# Patient Record
Sex: Female | Born: 1995 | Race: White | Hispanic: No | Marital: Single | Smoking: Never smoker
Health system: Southern US, Community
[De-identification: ages and names within clinical notes are randomized; demographics above are authoritative.]

---

## 2017-10-27 ENCOUNTER — Ambulatory Visit (INDEPENDENT_AMBULATORY_CARE_PROVIDER_SITE_OTHER): Payer: BLUE CROSS/BLUE SHIELD | Admitting: Family Medicine

## 2017-10-27 ENCOUNTER — Encounter: Payer: Self-pay | Admitting: Family Medicine

## 2017-10-27 VITALS — BP 118/69 | HR 52 | Temp 98.2°F | Resp 14

## 2017-10-27 DIAGNOSIS — R5383 Other fatigue: Secondary | ICD-10-CM

## 2017-10-27 DIAGNOSIS — M76899 Other specified enthesopathies of unspecified lower limb, excluding foot: Secondary | ICD-10-CM

## 2017-10-27 DIAGNOSIS — M6580 Other synovitis and tenosynovitis, unspecified site: Secondary | ICD-10-CM

## 2017-10-28 LAB — CBC WITH DIFFERENTIAL/PLATELET
BASOS ABS: 0 10*3/uL (ref 0.0–0.2)
Basos: 0 %
EOS (ABSOLUTE): 0.2 10*3/uL (ref 0.0–0.4)
Eos: 2 %
HEMOGLOBIN: 12 g/dL (ref 11.1–15.9)
Hematocrit: 35.9 % (ref 34.0–46.6)
IMMATURE GRANS (ABS): 0 10*3/uL (ref 0.0–0.1)
IMMATURE GRANULOCYTES: 0 %
LYMPHS: 22 %
Lymphocytes Absolute: 1.9 10*3/uL (ref 0.7–3.1)
MCH: 30.5 pg (ref 26.6–33.0)
MCHC: 33.4 g/dL (ref 31.5–35.7)
MCV: 91 fL (ref 79–97)
MONOCYTES: 8 %
Monocytes Absolute: 0.7 10*3/uL (ref 0.1–0.9)
NEUTROS PCT: 68 %
Neutrophils Absolute: 6.1 10*3/uL (ref 1.4–7.0)
PLATELETS: 266 10*3/uL (ref 150–379)
RBC: 3.94 x10E6/uL (ref 3.77–5.28)
RDW: 13.2 % (ref 12.3–15.4)
WBC: 8.9 10*3/uL (ref 3.4–10.8)

## 2017-10-28 LAB — IRON AND TIBC
Iron Saturation: 30 % (ref 15–55)
Iron: 103 ug/dL (ref 27–159)
TIBC: 343 ug/dL (ref 250–450)
UIBC: 240 ug/dL (ref 131–425)

## 2017-10-28 LAB — VITAMIN D 25 HYDROXY (VIT D DEFICIENCY, FRACTURES): VIT D 25 HYDROXY: 27.2 ng/mL — AB (ref 30.0–100.0)

## 2017-10-28 LAB — TSH: TSH: 2.11 u[IU]/mL (ref 0.450–4.500)

## 2017-10-28 LAB — FERRITIN: FERRITIN: 71 ng/mL (ref 15–77)

## 2017-11-08 ENCOUNTER — Ambulatory Visit
Admission: RE | Admit: 2017-11-08 | Discharge: 2017-11-08 | Disposition: A | Discharge status: Home / Self Care | Payer: BLUE CROSS/BLUE SHIELD | Source: Ambulatory Visit | Attending: Family Medicine | Admitting: Family Medicine

## 2017-11-08 DIAGNOSIS — R102 Pelvic and perineal pain: Secondary | ICD-10-CM | POA: Insufficient documentation

## 2017-11-08 DIAGNOSIS — M76899 Other specified enthesopathies of unspecified lower limb, excluding foot: Secondary | ICD-10-CM

## 2017-12-06 ENCOUNTER — Ambulatory Visit (INDEPENDENT_AMBULATORY_CARE_PROVIDER_SITE_OTHER): Payer: PRIVATE HEALTH INSURANCE | Admitting: Family Medicine

## 2017-12-06 ENCOUNTER — Encounter: Payer: Self-pay | Admitting: Family Medicine

## 2017-12-06 VITALS — BP 111/72 | HR 54 | Temp 97.9°F | Resp 14

## 2017-12-06 DIAGNOSIS — H1089 Other conjunctivitis: Secondary | ICD-10-CM

## 2017-12-06 DIAGNOSIS — L03031 Cellulitis of right toe: Secondary | ICD-10-CM

## 2017-12-06 MED ORDER — SULFAMETHOXAZOLE-TRIMETHOPRIM 800-160 MG PO TABS: 1.0000 | ORAL_TABLET | Freq: Two times a day (BID) | ORAL | 0 refills | Status: DC

## 2017-12-06 MED ORDER — POLYMYXIN B-TRIMETHOPRIM 10000-0.1 UNIT/ML-% OP SOLN: 1.0000 [drp] | Freq: Four times a day (QID) | OPHTHALMIC | 0 refills | Status: DC

## 2017-12-06 NOTE — Progress Notes (Signed)
Patient presents today with symptoms of right great toe pain. Patient states that she has had an ingrown toenail for a few days with discharge that was expressed yesterday. She states that the pain has improved. She denies any other problems at the toe. Patient also presents with discharge from the left eye this morning. There is mild redness of the eye. She denies any other significant URI symptoms besides a dry cough. She denies any fever or chills. She does wear contact lenses and does take them out nightly. They are monthly contact lenses.  ROS: Negative except mentioned above. Vitals as per Epic. GENERAL: NAD HEENT: no pharyngeal erythema, no exudate, no erythema of TMs, no cervical LAD, mild erythema of left conjunctiva, no significant discharge noted RESP: CTA B CARD: RRR SKIN: small paronychia right big toe, mild erythema along the lateral border of the right great toe, no significant discharge from the area, mild tenderness of the area to palpation NEURO: CN II-XII grossly intact   A/P: Paronychia - will treat with Bactrim DS, keep area clean and dry, if symptoms persist we'll send patient to podiatrist.   Conjunctivitis left eye - will treat with Polytrim, frequent handwashing, Claritin when necessary, do not wear contact lenses until symptoms have resolved, wash any linens that have touched the face, no athletic activity in the weight room today, seek medical attention if symptoms persist or worsen as discussed.

## 2017-12-12 ENCOUNTER — Ambulatory Visit (INDEPENDENT_AMBULATORY_CARE_PROVIDER_SITE_OTHER): Payer: PRIVATE HEALTH INSURANCE | Admitting: Family Medicine

## 2017-12-12 ENCOUNTER — Ambulatory Visit
Admission: RE | Admit: 2017-12-12 | Discharge: 2017-12-12 | Disposition: A | Discharge status: Home / Self Care | Payer: BLUE CROSS/BLUE SHIELD | Source: Ambulatory Visit | Attending: Family Medicine | Admitting: Family Medicine

## 2017-12-12 ENCOUNTER — Encounter: Payer: Self-pay | Admitting: Family Medicine

## 2017-12-12 VITALS — BP 114/75 | HR 51 | Temp 97.6°F | Resp 14

## 2017-12-12 DIAGNOSIS — R059 Cough, unspecified: Secondary | ICD-10-CM

## 2017-12-12 DIAGNOSIS — R05 Cough: Secondary | ICD-10-CM

## 2017-12-12 MED ORDER — BENZONATATE 100 MG PO CAPS: 100.0000 mg | ORAL_CAPSULE | Freq: Two times a day (BID) | ORAL | 0 refills | Status: AC | PRN

## 2017-12-13 NOTE — Progress Notes (Signed)
Patient presents today with symptoms of cough. She does admit that it is productive at times. She has been having a cough about 3 weeks. She states that initially started off like a cold. She is unsure of any new allergens. She denies any chest pain or shortness of breath. She denies any fever, weight loss, severe headache. She denies any history of asthma. Patient is continuing to take Bactrim that was prescribed for her paronychia. She denies any toe pain at this time and states that it is much better.  ROS: Negative except mentioned above. Vitals as per Epic. GENERAL: NAD HEENT: mild pharyngeal erythema, no exudate, no erythema of TMs, no cervical LAD RESP: CTA B CARD: RRR NEURO: CN II-XII grossly intact   A/P: Bronchitis - will get chest x-ray given length of time of symptoms, would finish course of Bactrim, Tessalon Perles when necessary, recommend starting oral antihistamine such as Claritin or Zyrtec daily, seek medical attention if symptoms persist or worsen as discussed. No athletic activity or class if febrile. Discussed above with trainer.

## 2017-12-16 ENCOUNTER — Other Ambulatory Visit: Payer: Self-pay | Admitting: Family Medicine

## 2017-12-16 MED ORDER — ALBUTEROL SULFATE HFA 108 (90 BASE) MCG/ACT IN AERS: 2.0000 | INHALATION_SPRAY | Freq: Four times a day (QID) | RESPIRATORY_TRACT | 1 refills | Status: AC | PRN

## 2017-12-19 ENCOUNTER — Ambulatory Visit (INDEPENDENT_AMBULATORY_CARE_PROVIDER_SITE_OTHER): Payer: BLUE CROSS/BLUE SHIELD | Admitting: Family Medicine

## 2017-12-19 DIAGNOSIS — R059 Cough, unspecified: Secondary | ICD-10-CM

## 2017-12-19 DIAGNOSIS — R05 Cough: Secondary | ICD-10-CM

## 2017-12-19 DIAGNOSIS — R0981 Nasal congestion: Secondary | ICD-10-CM

## 2017-12-19 NOTE — Progress Notes (Signed)
Patient continues to have symptoms of cough and nasal congestion. Patient states that the albuterol inhaler has helped some. The [employer]les have helped with her cough as well. She feels that her nasal congestion has gotten worse. She is taking an oral antihistamine but not a decongestant or nasal steroid. She has felt wheezing at times with activity. She denies any chest pain or increased shortness of breath at rest. She denies any fever or night sweats. She has finished her prescription of Bactrim. Her chest x-ray was negative for pneumonia.  ROS: Negative except mentioned above. Vitals as per Epic. GENERAL: NAD HEENT: mild pharyngeal erythema, no exudate, no erythema of TMs, no cervical LAD RESP: Minimal expiratory wheeze bilaterally, no accessory muscle use CARD: RRR NEURO: CN II-XII grossly intact   A/P: Bronchitis, Nasal Congestion - advised patient to take Flonase or Sudafed along with her Claritin for her nasal congestion, will do CBC and mono spot test given length of symptoms, will also start patient on 6 day taper dose of Prednisone, continue to use Albuterol Inhaler 30 minutes before exercise and every 4-6 hours as needed. If any worsening of breathing symptoms and needs to seek medical attention. Discussed with trainer that she should do activity as tolerated. We'll advise on further treatment if needed once lab results have been reviewed.

## 2017-12-20 LAB — MONONUCLEOSIS SCREEN: MONO SCREEN: NEGATIVE

## 2017-12-20 LAB — CBC WITH DIFFERENTIAL/PLATELET
BASOS ABS: 0 10*3/uL (ref 0.0–0.2)
Basos: 0 %
EOS (ABSOLUTE): 0.2 10*3/uL (ref 0.0–0.4)
Eos: 4 %
HEMOGLOBIN: 12.9 g/dL (ref 11.1–15.9)
Hematocrit: 39.4 % (ref 34.0–46.6)
IMMATURE GRANS (ABS): 0 10*3/uL (ref 0.0–0.1)
Immature Granulocytes: 0 %
LYMPHS: 35 %
Lymphocytes Absolute: 1.9 10*3/uL (ref 0.7–3.1)
MCH: 30.3 pg (ref 26.6–33.0)
MCHC: 32.7 g/dL (ref 31.5–35.7)
MCV: 93 fL (ref 79–97)
MONOCYTES: 9 %
Monocytes Absolute: 0.5 10*3/uL (ref 0.1–0.9)
NEUTROS PCT: 52 %
Neutrophils Absolute: 2.8 10*3/uL (ref 1.4–7.0)
PLATELETS: 332 10*3/uL (ref 150–379)
RBC: 4.26 x10E6/uL (ref 3.77–5.28)
RDW: 13.5 % (ref 12.3–15.4)
WBC: 5.4 10*3/uL (ref 3.4–10.8)

## 2018-01-02 ENCOUNTER — Encounter: Payer: Self-pay | Admitting: Family Medicine

## 2018-01-02 ENCOUNTER — Ambulatory Visit (INDEPENDENT_AMBULATORY_CARE_PROVIDER_SITE_OTHER): Payer: BLUE CROSS/BLUE SHIELD | Admitting: Family Medicine

## 2018-01-02 VITALS — BP 105/71 | HR 85 | Temp 97.9°F | Resp 14

## 2018-01-02 DIAGNOSIS — J0110 Acute frontal sinusitis, unspecified: Secondary | ICD-10-CM

## 2018-01-02 MED ORDER — FLUTICASONE PROPIONATE 50 MCG/ACT NA SUSP: 2.0000 | Freq: Every day | NASAL | 0 refills | Status: DC

## 2018-01-02 MED ORDER — AMOXICILLIN-POT CLAVULANATE 875-125 MG PO TABS: 1.0000 | ORAL_TABLET | Freq: Two times a day (BID) | ORAL | 0 refills | Status: AC

## 2018-01-03 NOTE — Progress Notes (Signed)
Patient presents again for symptoms of nasal congestion, sinus pressure. Patient states that her cough has improved but does have it at times. She has been using the oral antihistamine with minimal improvement. She did not start the decongestant as we discussed last time. She denies any fever. She does state that her mucous is thick and green in color. She has sinus pressure and mild headache. She denies any chest pain, shortness of breath, weight loss, nausea, vomiting, diarrhea. She denies any new allergens. There was an episode of mold in her dorm a few months ago which was cleaned up. Her roommates do not have any similar symptoms. She has been using her Albuterol Inhaler before physical activity. Patient has had a chest x-ray, CBC, mono test which were all normal.patient also finished a course of Bactrim initially when she had a productive cough and paronychia. She denies any known history of asthma.  ROS: Negative except mentioned above.  GENERAL: NAD HEENT: mild pharyngeal erythema, no exudate, no erythema of TMs, mild tenderness of the frontal sinuses on palpation, mild erythema of nasal mucosa bilaterally, no cervical LAD RESP: CTA B CARD: RRR ABD: positive bowel sounds, nontender, no organomegaly appreciated NEURO: CN II-XII grossly intact   A/P: Sinusitis - will treat patient with Augmentin, tapered dose of Prednisonefor a longer period of time discussed and also  continuing oral antihistamine. Would also recommend starting nasal steroid or decongestant. If symptoms do not resolve or worsen would recommend referral to ENT. If symptoms do not resolve would also recommend reevaluating dorm room for mold again. Seek medical attention if any acute problems. Activity as tolerated. Continue to use Albuterol Inhaler before and during activity as needed. Patient addresses understanding of above and risks of using Prednisone. Will follow up as needed.

## 2018-01-12 NOTE — Progress Notes (Signed)
Patient presents with bilateral adductor discomfort. Patient believes the symptoms are due to the short fast sprints that she has been running during conditioning. She states that running longer distances does not cause her discomfort. She denies any groin pain or back pain associated with this. She denies any bruising or swelling of the area. Given this discomfort with short sprints her coach advised her to come and get blood work to make sure it isn't related to anemia.Patient denies any problems with anemia in the past. She denies any heavy menstrual cycles, also blood in urine or stool, restrictive eating behaviors, excessive fatigue, chest pain, shortness of breath.  ROS: Negative except mentioned above. Vitals as per Epic. GENERAL: NAD HEENT: no pharyngeal erythema, no exudate, no erythema of TMs, no cervical LAD RESP: CTA B CARD: RRR MSK: mild tenderness in the proximal abductor muscle group right greater than left, no ecchymosis or obvious swelling appreciated, mild discomfort with resisted adduction, no groin tenderness or bulge appreciated NEURO: CN II-XII grossly intact   A/P: Adductor Tendinitis - likely due to overuse, discussed modifying activity,recommend patient also see PT to see if there are any muscle imbalances, NSAIDs when necessary, seek medical attention if symptoms persist or worsen as discussed. Above was discussed with trainer.  Physical Fatigue - I believe this is related to her MSK discomfort above, will however do some basic lab work such as CBC, iron studies, vitamin D, he will inform patient of supplement if needed.

## 2018-01-23 ENCOUNTER — Other Ambulatory Visit: Payer: Self-pay | Admitting: Family Medicine

## 2018-01-23 ENCOUNTER — Ambulatory Visit
Admission: RE | Admit: 2018-01-23 | Discharge: 2018-01-23 | Disposition: A | Discharge status: Home / Self Care | Payer: BLUE CROSS/BLUE SHIELD | Source: Ambulatory Visit | Attending: Family Medicine | Admitting: Family Medicine

## 2018-01-23 DIAGNOSIS — S0992XA Unspecified injury of nose, initial encounter: Secondary | ICD-10-CM

## 2018-01-23 DIAGNOSIS — S022XXA Fracture of nasal bones, initial encounter for closed fracture: Secondary | ICD-10-CM | POA: Diagnosis not present

## 2018-01-23 DIAGNOSIS — X58XXXA Exposure to other specified factors, initial encounter: Secondary | ICD-10-CM | POA: Diagnosis not present

## 2018-01-24 ENCOUNTER — Ambulatory Visit (INDEPENDENT_AMBULATORY_CARE_PROVIDER_SITE_OTHER): Payer: BLUE CROSS/BLUE SHIELD | Admitting: Family Medicine

## 2018-01-24 ENCOUNTER — Encounter: Payer: Self-pay | Admitting: Family Medicine

## 2018-01-24 VITALS — BP 105/66 | HR 74 | Temp 97.7°F | Resp 14

## 2018-01-24 DIAGNOSIS — S022XXA Fracture of nasal bones, initial encounter for closed fracture: Secondary | ICD-10-CM

## 2018-01-24 NOTE — Progress Notes (Signed)
Patient presents today for recent nose injury 2 days ago. Patient states that a ball hit her right on the nose during practice drills with Lacrosse. She denies any concussive symptoms. She states that the nose did bleed that day but has stopped. She denies any headache, dizziness, vision problems. She states that breathing out of the left nostril is mildly difficult. She denies any blood going down the back of her throat. She denies any previous injury to the nose in the past. She has not been taking any medications consistently for her nose pain/swelling.  ROS: Negative except mentioned above. Vitals as per Epic. GENERAL: NAD HEENT: no pharyngeal erythema, no exudate, no erythema of TMs, no cervical LAD, mild tenderness along the bridge of the nose and on the sides proximally, moderate erythema of nasal mucosa bilaterally, no clots appreciated RESP: CTA B CARD: RRR NEURO: CN II-XII grossly intact   A/P: Nondisplaced Nasal Fracture - reviewed films with patient, will refer patient to ENT to be seen over the next 1-2 weeks, decreased swelling with NSAID/ice, patient would like to wear a custom mask during [employer] activity,she understands the risks of playing with a nasal fracture, discussed above with trainer, patient can do noncontact activity for now until she gets mask, should stop activity if she has any problems, seek medical attention if any worsening symptoms.

## 2018-01-27 ENCOUNTER — Other Ambulatory Visit: Payer: Self-pay | Admitting: Family Medicine

## 2018-01-27 DIAGNOSIS — S022XXA Fracture of nasal bones, initial encounter for closed fracture: Secondary | ICD-10-CM

## 2018-01-29 ENCOUNTER — Other Ambulatory Visit: Payer: Self-pay | Admitting: Family Medicine

## 2018-02-06 IMAGING — CR DG PELVIS 1-2V
1 series · 1 of 1 positions shown · non-contrast
Comparison: None.

CLINICAL DATA: Pain posteriorly within the pelvis for 3 weeks, no
trauma

EXAM:
PELVIS - 1-2 VIEW

[dg pelvis 1-2 views]
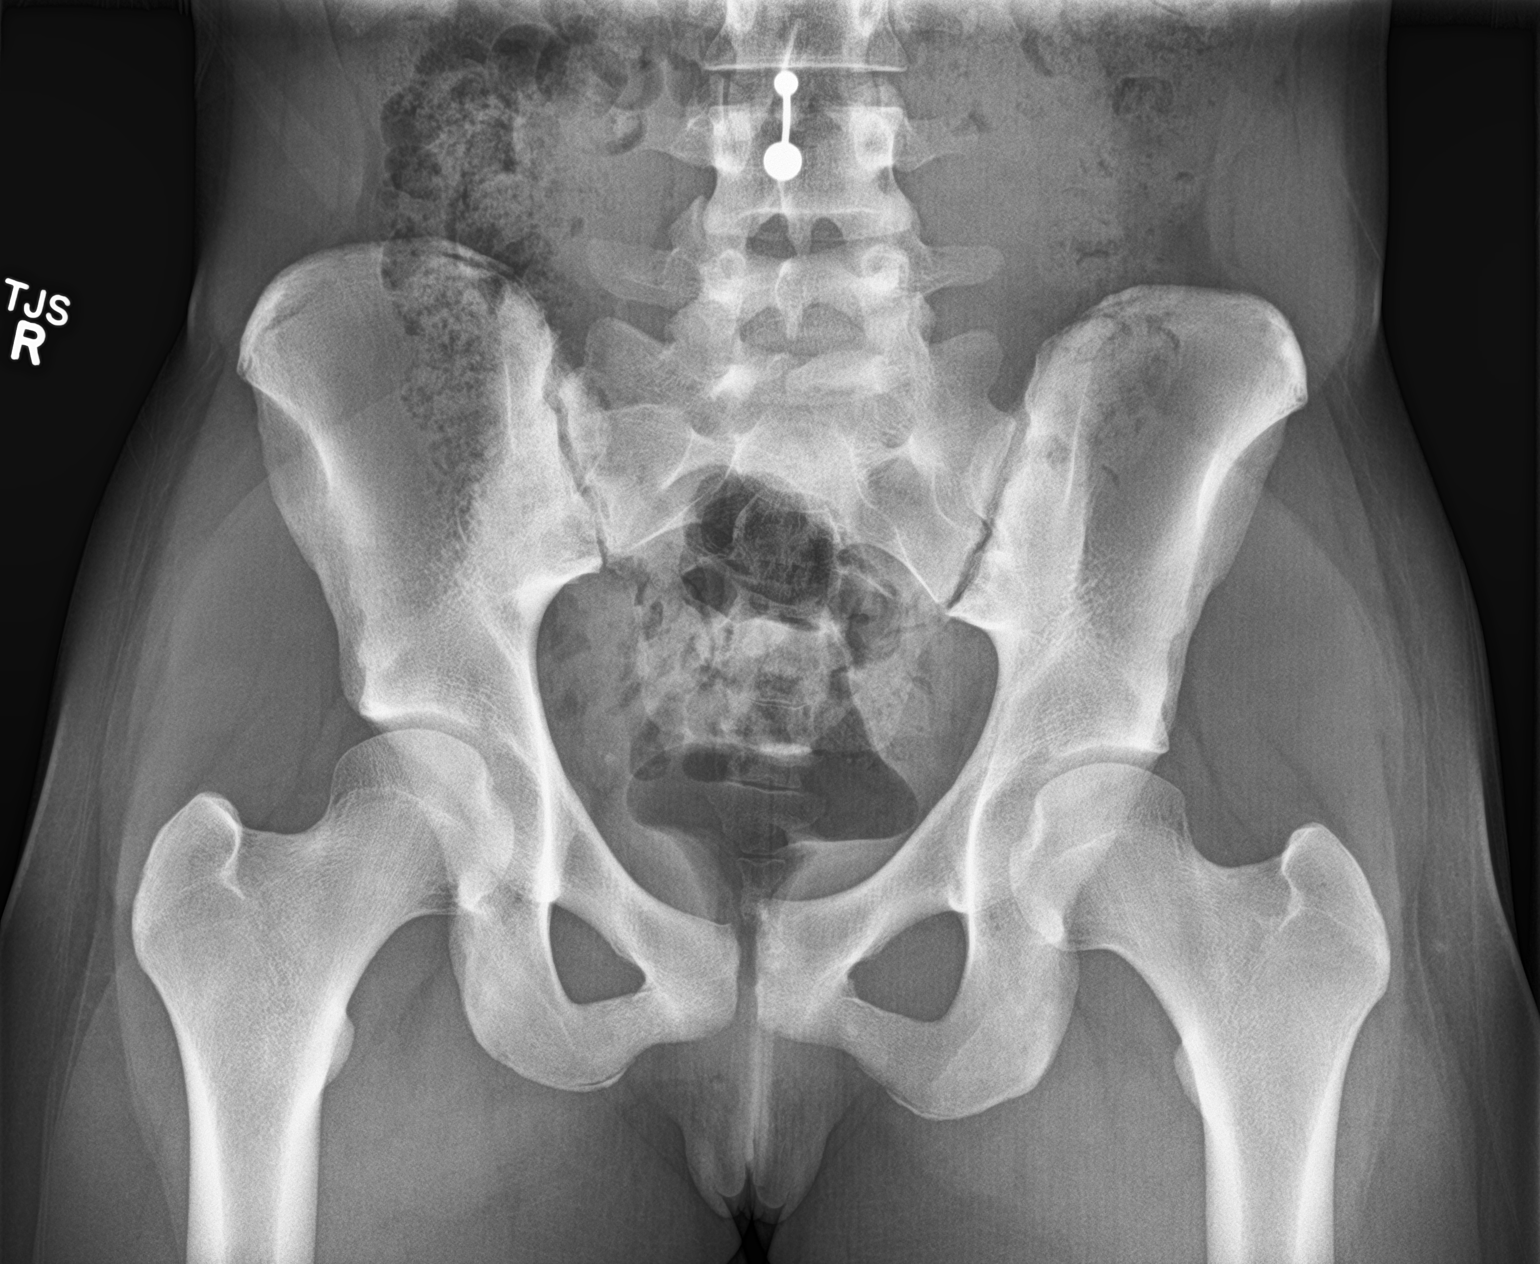

[1 of 1 positions shown; findings below may reference images not displayed]

FINDINGS: The hip joint spaces appear normal. No finding to indicate CAM
deformity is seen. The pelvic rami are intact. The SI joints are
corticated with slight irregularity of the left SI joint noted.
IMPRESSION: No acute abnormality. No evidence of CAM deformity. Slight
irregularity of the left SI joint of questionable significance.

## 2018-04-23 IMAGING — CR DG NASAL BONES 3+V
1 series · 4 of 4 positions shown · non-contrast
Comparison: None.

CLINICAL DATA: 21-year-old female with a history of facial injury
during lacrosse

EXAM:
NASAL BONES - 3+ VIEW

[Series 1: dg nasal bones · 0.14mm/px · 4 of 4 slices shown]
[im 1/4]
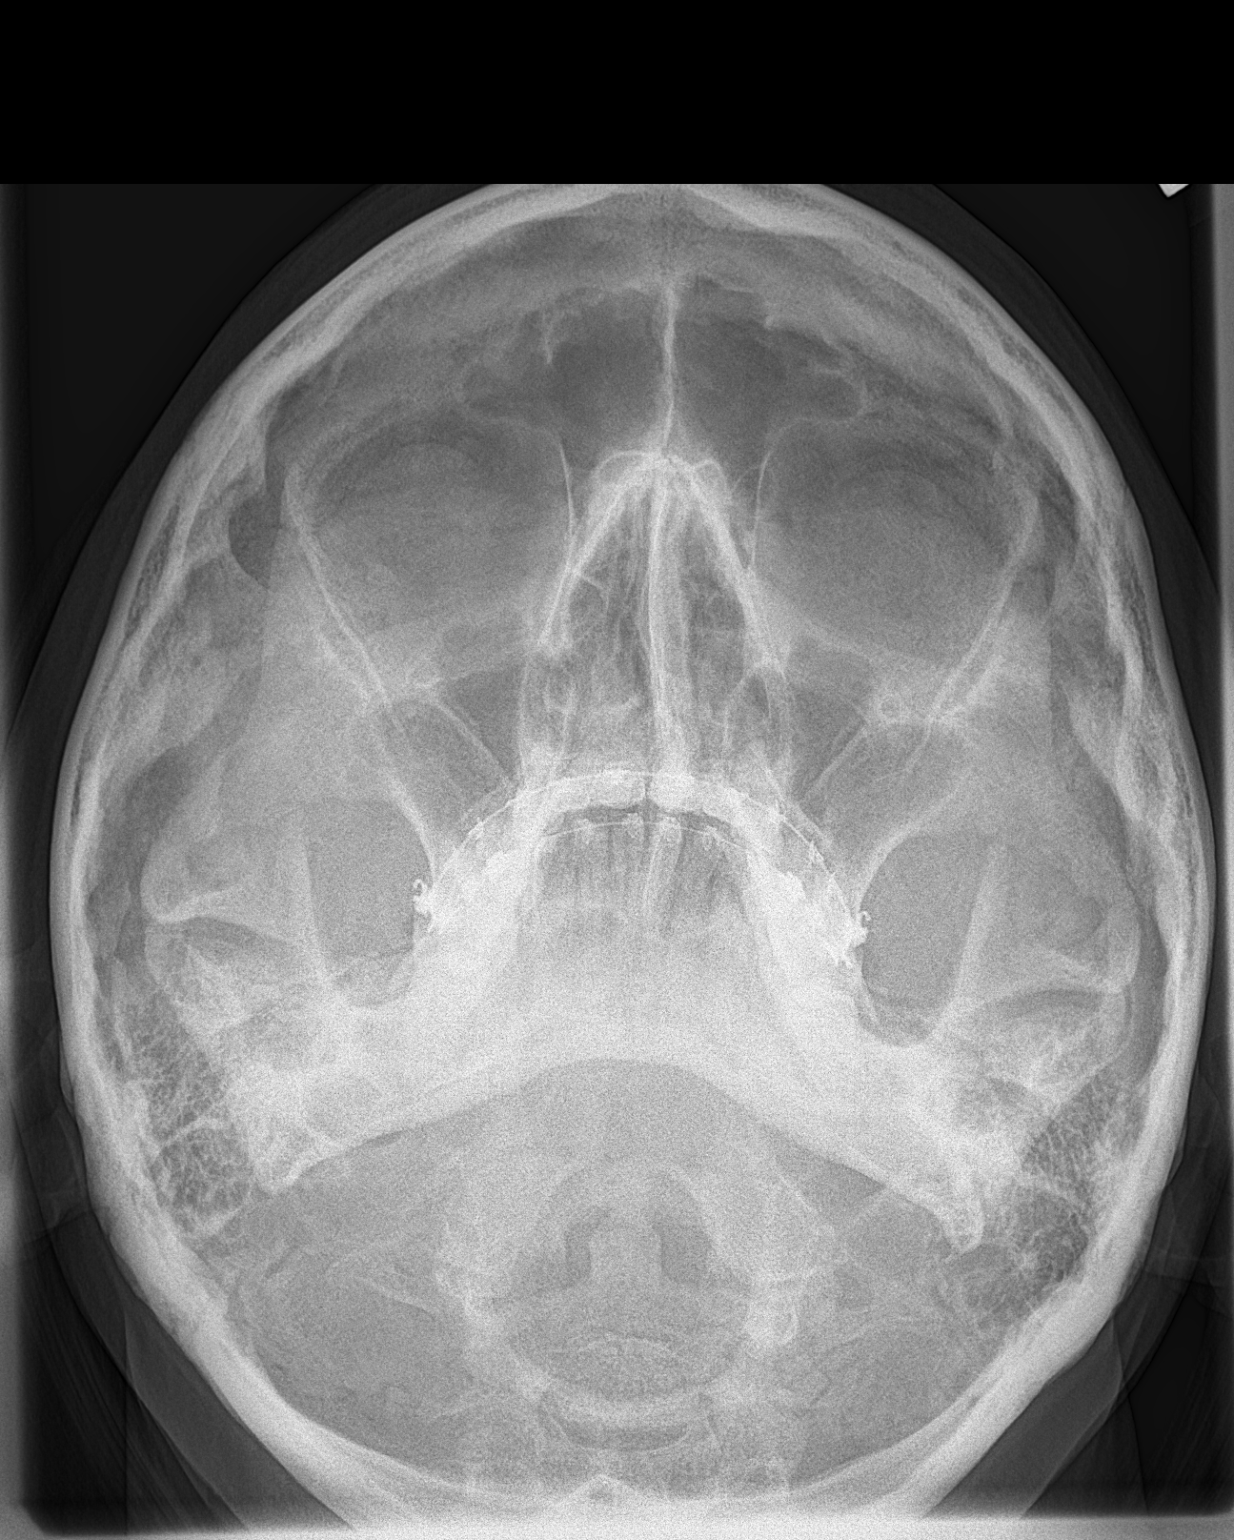
[im 2/4]
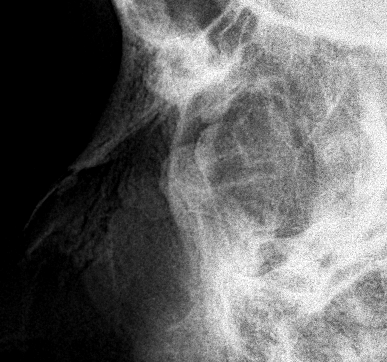
[im 3/4]
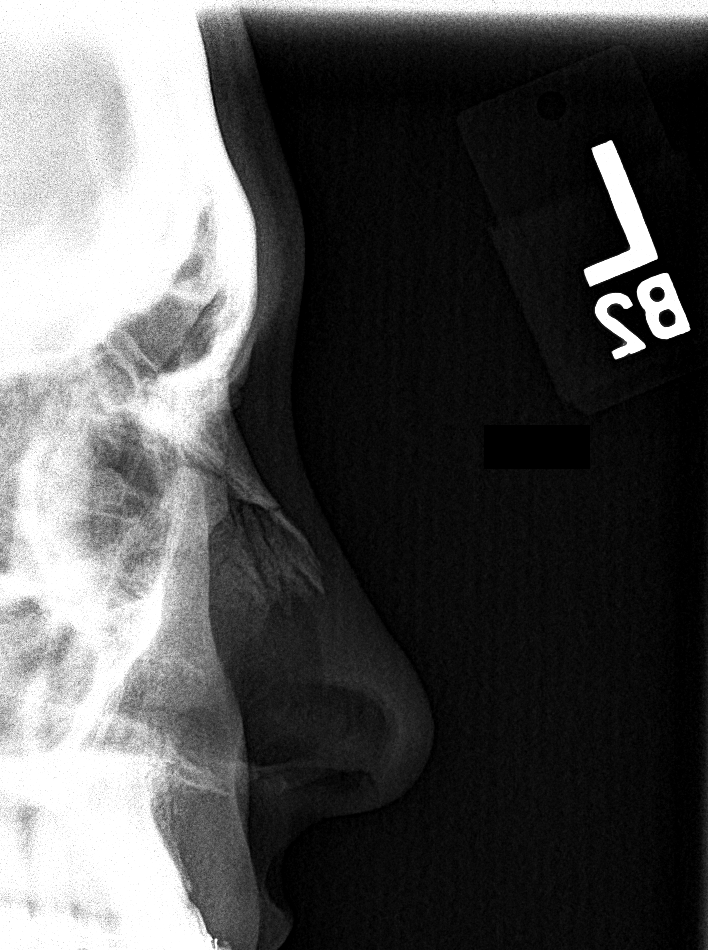
[im 4/4]
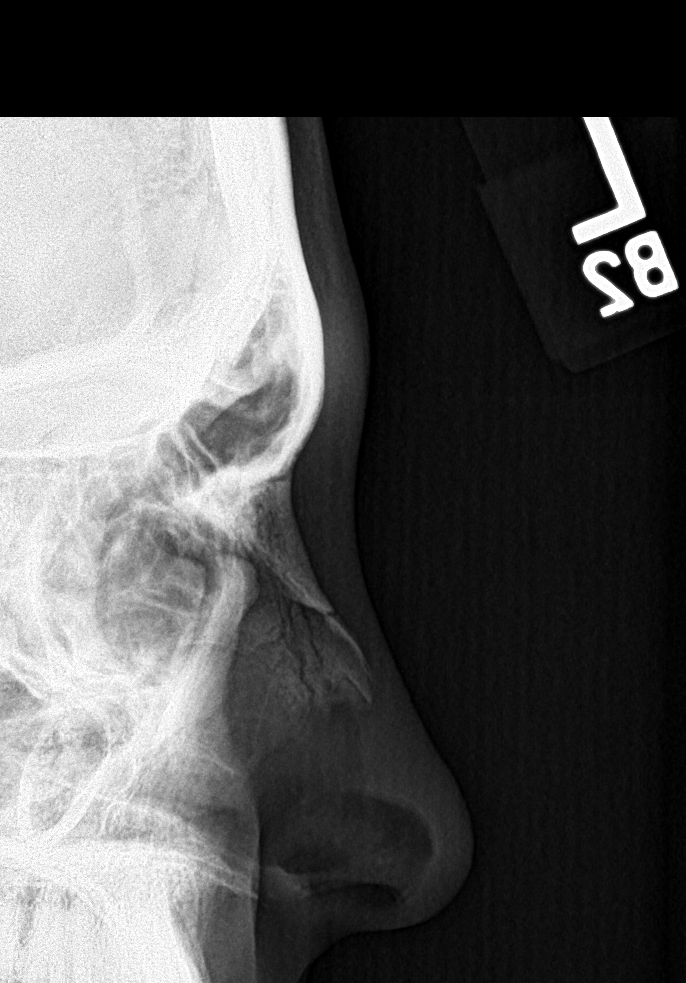

[4 of 4 positions shown; findings below may reference images not displayed]

FINDINGS: Lateral view demonstrates nondisplaced nasal bone fractures. No soft
tissue swelling.
IMPRESSION: Nondisplaced nasal bone fracture.
